# Patient Record
Sex: Male | Born: 1975 | Race: Black or African American | Hispanic: No | Marital: Married | Smoking: Current every day smoker
Health system: Southern US, Community
[De-identification: ages and names within clinical notes are randomized; demographics above are authoritative.]

---

## 2020-11-07 ENCOUNTER — Emergency Department: Payer: HRSA Program

## 2020-11-07 ENCOUNTER — Emergency Department
Admission: EM | Admit: 2020-11-07 | Discharge: 2020-11-07 | Disposition: A | Discharge status: Home / Self Care | Payer: HRSA Program | Attending: Emergency Medicine | Admitting: Emergency Medicine

## 2020-11-07 DIAGNOSIS — R509 Fever, unspecified: Secondary | ICD-10-CM | POA: Diagnosis present

## 2020-11-07 DIAGNOSIS — U071 COVID-19: Secondary | ICD-10-CM | POA: Diagnosis not present

## 2020-11-07 DIAGNOSIS — F17200 Nicotine dependence, unspecified, uncomplicated: Secondary | ICD-10-CM | POA: Diagnosis not present

## 2020-11-07 LAB — RESP PANEL BY RT-PCR (FLU A&B, COVID) ARPGX2
Influenza A by PCR: NEGATIVE
Influenza B by PCR: NEGATIVE
SARS Coronavirus 2 by RT PCR: POSITIVE — AB

## 2020-11-07 MED ORDER — ACETAMINOPHEN 500 MG PO TABS: 500.0000 mg | ORAL_TABLET | Freq: Four times a day (QID) | ORAL | 0 refills | Status: AC | PRN

## 2020-11-07 MED ORDER — KETOROLAC TROMETHAMINE 30 MG/ML IJ SOLN
30.0000 mg | Freq: Once | INTRAMUSCULAR | Status: AC
  Administered 2020-11-07: 30 mg via INTRAMUSCULAR
  Filled 2020-11-07: qty 1

## 2020-11-07 MED ORDER — ACETAMINOPHEN 500 MG PO TABS
1000.0000 mg | ORAL_TABLET | Freq: Once | ORAL | Status: AC
  Administered 2020-11-07: 1000 mg via ORAL
  Filled 2020-11-07: qty 2

## 2020-11-07 MED ORDER — IBUPROFEN 600 MG PO TABS: 600.0000 mg | ORAL_TABLET | Freq: Four times a day (QID) | ORAL | 0 refills | Status: AC | PRN

## 2020-11-07 NOTE — ED Notes (Signed)
Pt unable to sign d/c signature due to failed topaz.

## 2020-11-07 NOTE — ED Provider Notes (Signed)
[facility] Emergency Department Provider Note  ____________________________________________  Time seen: Approximately 1:53 PM  I have reviewed the triage vital signs and the nursing notes.   HISTORY  Chief Complaint Fever (bod) and Generalized Body Aches    HPI Philip Stevens is a 45 y.o. male that presents to the emergency department for evaluation of fever, headache, and body aches for 2 days.  Patient denies any sick contacts.  He has not received a Covid vaccination.  No nasal congestion, cough, shortness of breath, chest pain, vomiting.  History reviewed. No pertinent past medical history.  There are no problems to display for this patient.   History reviewed. No pertinent surgical history.  Prior to Admission medications   Medication Sig Start Date End Date Taking? Authorizing Provider  acetaminophen (TYLENOL) 500 MG tablet Take 1 tablet (500 mg total) by mouth every 6 (six) hours as needed. 11/07/20  Yes Enid Derry, PA-C  ibuprofen (ADVIL) 600 MG tablet Take 1 tablet (600 mg total) by mouth every 6 (six) hours as needed. 11/07/20  Yes Enid Derry, PA-C    Allergies Patient has no allergy information on record.  History reviewed. No pertinent family history.  Social History Social History   Tobacco Use  . Smoking status: Current Every Day Smoker  . Smokeless tobacco: Never Used  Substance Use Topics  . Alcohol use: Not Currently  . Drug use: Not Currently     Review of Systems  Constitutional: Positive for fever. Eyes: No visual changes. No discharge. ENT: Negative for congestion and rhinorrhea. Cardiovascular: No chest pain. Respiratory: Negative for cough. No SOB. Gastrointestinal: No abdominal pain.  No nausea, no vomiting.  No diarrhea.  No constipation. Musculoskeletal: Positive for body aches. Skin: Negative for rash, abrasions, lacerations, ecchymosis. Neurological: Positive for headache.     ____________________________________________   PHYSICAL EXAM:  VITAL SIGNS: ED Triage Vitals  Enc Vitals Group     BP 11/07/20 1234 125/87     Pulse Rate 11/07/20 1234 98     Resp 11/07/20 1234 20     Temp 11/07/20 1234 (!) 102.8 F (39.3 C)     Temp Source 11/07/20 1234 Oral     SpO2 11/07/20 1234 99 %     Weight 11/07/20 1234 220 lb (99.8 kg)     Height 11/07/20 1234 6\' 1"  (1.854 m)     Head Circumference --      Peak Flow --      Pain Score 11/07/20 1233 9     Pain Loc --      Pain Edu? --      Excl. in GC? --      Constitutional: Alert and oriented. Well appearing and in no acute distress. Eyes: Conjunctivae are normal. PERRL. EOMI. No discharge. Head: Atraumatic. ENT: No frontal and maxillary sinus tenderness.      Ears: Tympanic membranes pearly gray with good landmarks. No discharge.      Nose: No congestion/rhinnorhea.      Mouth/Throat: Mucous membranes are moist. Oropharynx non-erythematous. Tonsils not enlarged. No exudates. Uvula midline. Neck: No stridor.   Hematological/Lymphatic/Immunilogical: No cervical lymphadenopathy. Cardiovascular: Normal rate, regular rhythm.  Good peripheral circulation. Respiratory: Normal respiratory effort without tachypnea or retractions. Lungs CTAB. Good air entry to the bases with no decreased or absent breath sounds. Gastrointestinal: Bowel sounds 4 quadrants. Soft and nontender to palpation. No guarding or rigidity. No palpable masses. No distention. Musculoskeletal: Full range of motion to all extremities. No gross deformities  appreciated. Neurologic:  Normal speech and language. No gross focal neurologic deficits are appreciated.  Skin:  Skin is warm, dry and intact. No rash noted. Psychiatric: Mood and affect are normal. Speech and behavior are normal. Patient exhibits appropriate insight and judgement.   ____________________________________________   LABS (all labs ordered are listed, but only abnormal results  are displayed)  Labs Reviewed  RESP PANEL BY RT-PCR (FLU A&B, COVID) ARPGX2 - Abnormal; Notable for the following components:      Result Value   SARS Coronavirus 2 by RT PCR POSITIVE (*)    All other components within normal limits   ____________________________________________  EKG   ____________________________________________  RADIOLOGY Lexine Baton, personally viewed and evaluated these images (plain radiographs) as part of my medical decision making, as well as reviewing the written report by the radiologist.  DG Chest 1 View  Result Date: 11/07/2020 CLINICAL DATA:  Fever EXAM: CHEST  1 VIEW COMPARISON:  None. FINDINGS: The heart size and mediastinal contours are within normal limits. Mildly increased perihilar and bibasilar interstitial markings. No lobar consolidation. No pleural effusion or pneumothorax. The visualized skeletal structures are unremarkable. IMPRESSION: Mildly increased perihilar and bibasilar interstitial markings, which could reflect bronchitic type lung changes versus developing atypical/viral infection. Electronically Signed   By: Duanne Guess D.O.   On: 11/07/2020 12:59    ____________________________________________    PROCEDURES  Procedure(s) performed:    Procedures    Medications  acetaminophen (TYLENOL) tablet 1,000 mg (1,000 mg Oral Given 11/07/20 1242)  ketorolac (TORADOL) 30 MG/ML injection 30 mg (30 mg Intramuscular Given 11/07/20 1354)     ____________________________________________   INITIAL IMPRESSION / ASSESSMENT AND PLAN / ED COURSE  Pertinent labs & imaging results that were available during my care of the patient were reviewed by me and considered in my medical decision making (see chart for details).  Review of the [place] CSRS was performed in accordance of the [employer] prior to dispensing any controlled drugs.    Patient's diagnosis is consistent with Covid 19. Vital signs and exam are reassuring. Patient appears well and  is staying well hydrated. Patient should alternate tylenol and ibuprofen for fever. Patient feels comfortable going home. Patient will be discharged home with prescriptions for tylenol and motrin. Patient is to follow up with PCP as needed or otherwise directed. Patient is given ED precautions to return to the ED for any worsening or new symptoms.  Philip Stevens was evaluated in Emergency Department on 11/07/2020 for the symptoms described in the history of present illness. He was evaluated in the context of the global COVID-19 pandemic, which necessitated consideration that the patient might be at risk for infection with the SARS-CoV-2 virus that causes COVID-19. Institutional protocols and algorithms that pertain to the evaluation of patients at risk for COVID-19 are in a state of rapid change based on information released by regulatory bodies including the CDC and federal and state organizations. These policies and algorithms were followed during the patient's care in the ED.   ____________________________________________  FINAL CLINICAL IMPRESSION(S) / ED DIAGNOSES  Final diagnoses:  COVID-19      NEW MEDICATIONS STARTED DURING THIS VISIT:  ED Discharge Orders         Ordered    ibuprofen (ADVIL) 600 MG tablet  Every 6 hours PRN        11/07/20 1500    acetaminophen (TYLENOL) 500 MG tablet  Every 6 hours PRN        11/07/20 1500  This chart was dictated using voice recognition software/Dragon. Despite best efforts to proofread, errors can occur which can change the meaning. Any change was purely unintentional.    Laban Emperor, PA-C 11/07/20 1611    Blake Divine, MD 11/10/20 [phone]

## 2020-11-07 NOTE — ED Triage Notes (Signed)
Pt arrives to ed via pov. C/o generalized body aches and chills at home starting yesterday. Denies any NVD.

## 2022-01-01 IMAGING — DX DG CHEST 1V
1 series · 1 of 1 positions shown · non-contrast
Comparison: None.

CLINICAL DATA: Fever

EXAM:
CHEST  1 VIEW

[chest ap]
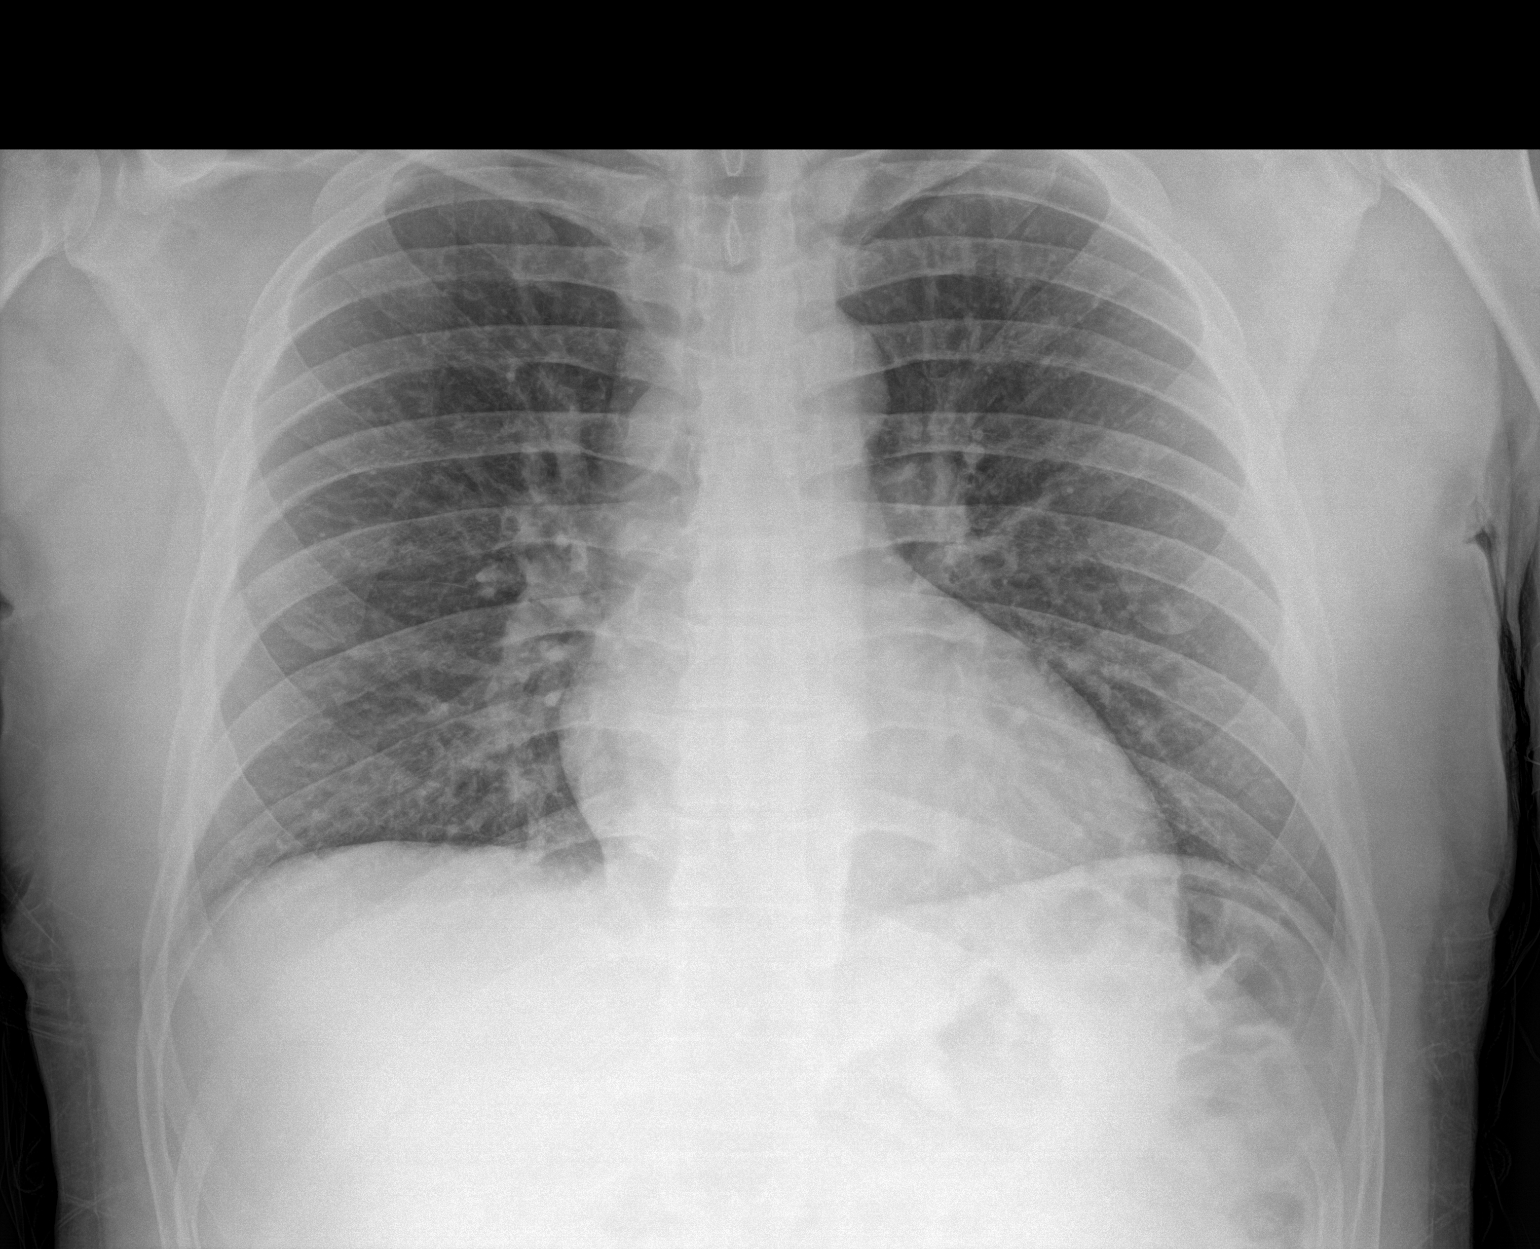

[1 of 1 positions shown; findings below may reference images not displayed]

FINDINGS: The heart size and mediastinal contours are within normal limits.
Mildly increased perihilar and bibasilar interstitial markings. No
lobar consolidation. No pleural effusion or pneumothorax. The
visualized skeletal structures are unremarkable.
IMPRESSION: Mildly increased perihilar and bibasilar interstitial markings,
which could reflect bronchitic type lung changes versus developing
atypical/viral infection.
